# Patient Record
Sex: Male | Born: 1960 | Race: White | Hispanic: No | Marital: Married | Smoking: Never smoker
Health system: Southern US, Community
[De-identification: ages and names within clinical notes are randomized; demographics above are authoritative.]

---

## 2001-07-27 ENCOUNTER — Encounter: Payer: Self-pay | Admitting: Surgery

## 2001-07-27 ENCOUNTER — Encounter: Admission: RE | Admit: 2001-07-27 | Discharge: 2001-07-27 | Payer: Self-pay | Admitting: Surgery

## 2001-07-28 ENCOUNTER — Ambulatory Visit (HOSPITAL_BASED_OUTPATIENT_CLINIC_OR_DEPARTMENT_OTHER): Admission: RE | Admit: 2001-07-28 | Discharge: 2001-07-28 | Payer: Self-pay | Admitting: Surgery

## 2001-07-28 ENCOUNTER — Encounter: Payer: Self-pay | Admitting: Surgery

## 2001-07-28 ENCOUNTER — Encounter (INDEPENDENT_AMBULATORY_CARE_PROVIDER_SITE_OTHER): Payer: Self-pay | Admitting: Specialist

## 2009-11-30 ENCOUNTER — Encounter: Admission: RE | Admit: 2009-11-30 | Discharge: 2009-11-30 | Payer: Self-pay | Admitting: Gastroenterology

## 2009-12-06 ENCOUNTER — Ambulatory Visit: Payer: Self-pay | Admitting: Oncology

## 2009-12-07 ENCOUNTER — Encounter (INDEPENDENT_AMBULATORY_CARE_PROVIDER_SITE_OTHER): Payer: Self-pay | Admitting: General Surgery

## 2009-12-07 ENCOUNTER — Inpatient Hospital Stay (HOSPITAL_COMMUNITY): Admission: RE | Admit: 2009-12-07 | Discharge: 2009-12-11 | Payer: Self-pay | Admitting: General Surgery

## 2009-12-24 LAB — CBC WITH DIFFERENTIAL/PLATELET
BASO%: 1.6 % (ref 0.0–2.0)
Basophils Absolute: 0.2 10*3/uL — ABNORMAL HIGH (ref 0.0–0.1)
EOS%: 3.2 % (ref 0.0–7.0)
Eosinophils Absolute: 0.3 10*3/uL (ref 0.0–0.5)
HCT: 31.8 % — ABNORMAL LOW (ref 38.4–49.9)
HGB: 9 g/dL — ABNORMAL LOW (ref 13.0–17.1)
LYMPH%: 19.7 % (ref 14.0–49.0)
MCH: 19.1 pg — ABNORMAL LOW (ref 27.2–33.4)
MCHC: 28.3 g/dL — ABNORMAL LOW (ref 32.0–36.0)
MCV: 67.4 fL — ABNORMAL LOW (ref 79.3–98.0)
MONO#: 0.6 10*3/uL (ref 0.1–0.9)
MONO%: 6.4 % (ref 0.0–14.0)
NEUT#: 6.6 10*3/uL — ABNORMAL HIGH (ref 1.5–6.5)
NEUT%: 69.1 % (ref 39.0–75.0)
Platelets: 551 10*3/uL — ABNORMAL HIGH (ref 140–400)
RBC: 4.72 10*6/uL (ref 4.20–5.82)
RDW: 21.5 % — ABNORMAL HIGH (ref 11.0–14.6)
WBC: 9.6 10*3/uL (ref 4.0–10.3)
lymph#: 1.9 10*3/uL (ref 0.9–3.3)
nRBC: 0 % (ref 0–0)

## 2010-01-18 ENCOUNTER — Ambulatory Visit: Payer: Self-pay | Admitting: Oncology

## 2010-01-21 LAB — CBC WITH DIFFERENTIAL/PLATELET
BASO%: 0 % (ref 0.0–2.0)
Basophils Absolute: 0 10*3/uL (ref 0.0–0.1)
EOS%: 0.9 % (ref 0.0–7.0)
Eosinophils Absolute: 0.1 10*3/uL (ref 0.0–0.5)
HCT: 37.3 % — ABNORMAL LOW (ref 38.4–49.9)
HGB: 11.8 g/dL — ABNORMAL LOW (ref 13.0–17.1)
LYMPH%: 15.3 % (ref 14.0–49.0)
MCH: 23.8 pg — ABNORMAL LOW (ref 27.2–33.4)
MCHC: 31.5 g/dL — ABNORMAL LOW (ref 32.0–36.0)
MCV: 75.5 fL — ABNORMAL LOW (ref 79.3–98.0)
MONO#: 0.5 10*3/uL (ref 0.1–0.9)
MONO%: 5.9 % (ref 0.0–14.0)
NEUT#: 6 10*3/uL (ref 1.5–6.5)
NEUT%: 77.9 % — ABNORMAL HIGH (ref 39.0–75.0)
Platelets: 322 10*3/uL (ref 140–400)
RBC: 4.94 10*6/uL (ref 4.20–5.82)
RDW: 30.8 % — ABNORMAL HIGH (ref 11.0–14.6)
WBC: 7.7 10*3/uL (ref 4.0–10.3)
lymph#: 1.2 10*3/uL (ref 0.9–3.3)

## 2010-01-31 ENCOUNTER — Ambulatory Visit: Payer: Self-pay | Admitting: Genetic Counselor

## 2010-03-21 ENCOUNTER — Ambulatory Visit: Payer: Self-pay | Admitting: Oncology

## 2010-03-25 LAB — CBC WITH DIFFERENTIAL/PLATELET
BASO%: 0.5 % (ref 0.0–2.0)
Basophils Absolute: 0 10*3/uL (ref 0.0–0.1)
EOS%: 1.4 % (ref 0.0–7.0)
Eosinophils Absolute: 0.1 10*3/uL (ref 0.0–0.5)
HCT: 43.6 % (ref 38.4–49.9)
HGB: 14.5 g/dL (ref 13.0–17.1)
LYMPH%: 19.3 % (ref 14.0–49.0)
MCH: 27.6 pg (ref 27.2–33.4)
MCHC: 33.2 g/dL (ref 32.0–36.0)
MCV: 82.9 fL (ref 79.3–98.0)
MONO#: 0.6 10*3/uL (ref 0.1–0.9)
MONO%: 7.9 % (ref 0.0–14.0)
NEUT#: 5.8 10*3/uL (ref 1.5–6.5)
NEUT%: 70.9 % (ref 39.0–75.0)
Platelets: 291 10*3/uL (ref 140–400)
RBC: 5.26 10*6/uL (ref 4.20–5.82)
RDW: 17 % — ABNORMAL HIGH (ref 11.0–14.6)
WBC: 8.2 10*3/uL (ref 4.0–10.3)
lymph#: 1.6 10*3/uL (ref 0.9–3.3)

## 2010-05-22 ENCOUNTER — Ambulatory Visit: Payer: Self-pay | Admitting: Oncology

## 2010-05-22 ENCOUNTER — Ambulatory Visit: Payer: Self-pay | Admitting: Genetic Counselor

## 2010-09-19 ENCOUNTER — Ambulatory Visit: Payer: Self-pay | Admitting: Oncology

## 2010-09-23 ENCOUNTER — Other Ambulatory Visit
Admission: RE | Admit: 2010-09-23 | Discharge: 2010-09-23 | Payer: Self-pay | Source: Home / Self Care | Admitting: Oncology

## 2010-09-23 LAB — CBC WITH DIFFERENTIAL/PLATELET
BASO%: 0.4 % (ref 0.0–2.0)
Basophils Absolute: 0 10*3/uL (ref 0.0–0.1)
EOS%: 1.8 % (ref 0.0–7.0)
Eosinophils Absolute: 0.1 10*3/uL (ref 0.0–0.5)
HCT: 42.1 % (ref 38.4–49.9)
HGB: 14.4 g/dL (ref 13.0–17.1)
LYMPH%: 23.8 % (ref 14.0–49.0)
MCH: 30 pg (ref 27.2–33.4)
MCHC: 34.2 g/dL (ref 32.0–36.0)
MCV: 87.5 fL (ref 79.3–98.0)
MONO#: 0.6 10*3/uL (ref 0.1–0.9)
MONO%: 7.8 % (ref 0.0–14.0)
NEUT#: 4.9 10*3/uL (ref 1.5–6.5)
NEUT%: 66.2 % (ref 39.0–75.0)
Platelets: 275 10*3/uL (ref 140–400)
RBC: 4.81 10*6/uL (ref 4.20–5.82)
RDW: 13.2 % (ref 11.0–14.6)
WBC: 7.4 10*3/uL (ref 4.0–10.3)
lymph#: 1.8 10*3/uL (ref 0.9–3.3)

## 2010-09-30 LAB — CYTOLOGY, URINE

## 2010-12-08 LAB — DIFFERENTIAL
Basophils Absolute: 0 10*3/uL (ref 0.0–0.1)
Basophils Relative: 0 % (ref 0–1)
Eosinophils Relative: 0 % (ref 0–5)
Lymphocytes Relative: 19 % (ref 12–46)
Lymphs Abs: 1.8 10*3/uL (ref 0.7–4.0)
Monocytes Absolute: 1.4 10*3/uL — ABNORMAL HIGH (ref 0.1–1.0)
Monocytes Relative: 8 % (ref 3–12)
Neutro Abs: 14.4 10*3/uL — ABNORMAL HIGH (ref 1.7–7.7)
Neutrophils Relative %: 70 % (ref 43–77)

## 2010-12-08 LAB — COMPREHENSIVE METABOLIC PANEL
BUN: 16 mg/dL (ref 6–23)
CO2: 28 mEq/L (ref 19–32)
Chloride: 106 mEq/L (ref 96–112)
Creatinine, Ser: 1.01 mg/dL (ref 0.4–1.5)
GFR calc non Af Amer: >60 mL/min (ref >60)
Total Bilirubin: 0.7 mg/dL (ref 0.3–1.2)

## 2010-12-08 LAB — CBC
HCT: 26.5 % — ABNORMAL LOW (ref 39.0–52.0)
Hemoglobin: 7.8 g/dL — ABNORMAL LOW (ref 13.0–17.0)
Hemoglobin: 8 g/dL — ABNORMAL LOW (ref 13.0–17.0)
MCHC: 29.6 g/dL — ABNORMAL LOW (ref 30.0–36.0)
MCHC: 30 g/dL (ref 30.0–36.0)
MCHC: 30.3 g/dL (ref 30.0–36.0)
MCHC: 30.4 g/dL (ref 30.0–36.0)
MCV: 63.2 fL — ABNORMAL LOW (ref 78.0–100.0)
MCV: 63.6 fL — ABNORMAL LOW (ref 78.0–100.0)
MCV: 63.6 fL — ABNORMAL LOW (ref 78.0–100.0)
MCV: 63.7 fL — ABNORMAL LOW (ref 78.0–100.0)
Platelets: 245 10*3/uL (ref 150–400)
RBC: 4.15 MIL/uL — ABNORMAL LOW (ref 4.22–5.81)
RBC: 4.15 MIL/uL — ABNORMAL LOW (ref 4.22–5.81)
RDW: 19.7 % — ABNORMAL HIGH (ref 11.5–15.5)
WBC: 8.2 10*3/uL (ref 4.0–10.5)

## 2010-12-08 LAB — CROSSMATCH: ABO/RH(D): A POS

## 2010-12-08 LAB — BASIC METABOLIC PANEL
CO2: 27 mEq/L (ref 19–32)
Chloride: 102 mEq/L (ref 96–112)
Creatinine, Ser: 0.95 mg/dL (ref 0.4–1.5)
GFR calc Af Amer: >60 mL/min (ref >60)
Sodium: 134 mEq/L — ABNORMAL LOW (ref 135–145)

## 2010-12-08 LAB — PROTIME-INR
INR: 1.03 (ref 0.00–1.49)
Prothrombin Time: 13.4 seconds (ref 11.6–15.2)

## 2011-01-31 NOTE — Op Note (Signed)
[place]. [facility]  Patient:    Jonathan Pennington, Jonathan Pennington Visit Number: [id number] MRN: [id number]          Service Type: DSU Location: [facility] Attending Physician:  Andre Lefort Proc. Date: 07/28/01 Admit Date:  07/28/2001   CC:         Kristian Covey, M.D., [facility]   Operative Report  DATE OF BIRTH:  10/29/1960  CCS NUMBER:  [id number]  PREOPERATIVE DIAGNOSIS:  Melanoma, right leg, Clarks level IV, Breslow level 0.81 millimeters.  POSTOPERATIVE DIAGNOSIS:  Melanoma, right leg, Clarks level IV, Breslow level 0.81 millimeters.  OPERATIONS PERFORMED: 1. Wide excision, melanoma, right thigh. 2. Right inguinal sentinel lymph node biopsy.  SURGEON:  Sandria Bales. Ezzard Standing, M.D.  ASSISTANT:  None.  ANESTHESIA:  General with an LMA anesthesia.  INDICATION FOR PROCEDURE:  Mr. Dilworth in a 50 year old white male who had a melanoma excised from the lateral aspect of his right thigh by Dr. Evelena Peat, which was a Breslows level 0.81 mm with a Clarks level IV.  He now comes for wide excision of this melanoma and a sentinel lymph node biopsy.  DESCRIPTION OF PROCEDURE:  The patient had been to radiology where he had injection of tagged radioisotope around the right thigh lesion.  He then had an LMA anesthesia, marked the skin, infiltrated with about a half of cc of __________________ around the melanoma site.  I was unable to get the neoprobe out and identify a hot spot in the right groin, which I marked. I then prepped him with Betadine solution and sterilely draped.  I went after the sentinel lymph node first, which was found in the right groin, fairly superficial.  I made about a 3 cm incision, excised the node.  The node was not blue, but was clearly hot with counts of 650 with background of four. There was no other hot node and I saw no blue dye.  __________________ a hot not blue node, closed the subcutaneous tissue  with 3-0 Vicryl suture and the skin with a 5-0 monocril subcuticular suture.  I then turned my attention to the right thigh where he had about a maybe an 8 mm healing wound.  I tried to get 1.5-2 cm around this on the right thigh in all directions.  I then did an elliptical incision, which was probably 9 cm long, about 4 cm wide, took this down to the fascial layer.  I marked the layer towards the head as being a short suture and the medial as being the long suture.  I then used the Bovie electrocautery for hemostasis, closed it with 3-0 Vicryl and 2-0 nylon suture in the skin in interrupted fashion.  Both were dressed with sterile gauze.  The right thigh was wrapped with an Ace bandage.  Patient tolerated the procedure well.  Pathology is pending at the time of this dictation.  He will be discharged home today and given Vicodin for pain, and see me back in a week for follow up. Attending Physician:  Andre Lefort DD:  07/28/01 TD:  07/28/01 Job: [id number]

## 2013-10-31 ENCOUNTER — Other Ambulatory Visit: Payer: Self-pay | Admitting: Neurosurgery

## 2013-10-31 DIAGNOSIS — M5416 Radiculopathy, lumbar region: Secondary | ICD-10-CM

## 2013-11-03 ENCOUNTER — Other Ambulatory Visit: Payer: Self-pay | Admitting: Neurosurgery

## 2013-11-03 ENCOUNTER — Inpatient Hospital Stay
Admission: RE | Admit: 2013-11-03 | Discharge: 2013-11-03 | Disposition: A | Discharge status: Home / Self Care | Payer: Self-pay | Source: Ambulatory Visit | Attending: Neurosurgery | Admitting: Neurosurgery

## 2013-11-03 ENCOUNTER — Ambulatory Visit
Admission: RE | Admit: 2013-11-03 | Discharge: 2013-11-03 | Disposition: A | Discharge status: Home / Self Care | Payer: BC Managed Care – PPO | Source: Ambulatory Visit | Attending: Neurosurgery | Admitting: Neurosurgery

## 2013-11-03 VITALS — BP 140/81 | HR 54 | Ht 69.0 in | Wt 190.0 lb

## 2013-11-03 DIAGNOSIS — M5416 Radiculopathy, lumbar region: Secondary | ICD-10-CM

## 2013-11-03 DIAGNOSIS — IMO0002 Reserved for concepts with insufficient information to code with codable children: Secondary | ICD-10-CM

## 2013-11-03 MED ORDER — IOHEXOL 180 MG/ML  SOLN
10.0000 mL | Freq: Once | INTRAMUSCULAR | Status: AC | PRN
  Administered 2013-11-03: 10 mL via INTRATHECAL

## 2013-11-03 MED ORDER — DIAZEPAM 5 MG PO TABS
10.0000 mg | ORAL_TABLET | Freq: Once | ORAL | Status: AC
  Administered 2013-11-03: 10 mg via ORAL

## 2013-11-03 NOTE — Discharge Instructions (Signed)

## 2013-11-07 ENCOUNTER — Inpatient Hospital Stay: Admission: RE | Admit: 2013-11-07 | Payer: Self-pay | Source: Ambulatory Visit

## 2013-11-07 ENCOUNTER — Other Ambulatory Visit: Payer: Self-pay

## 2014-09-15 IMAGING — CT CT L SPINE W/ CM
4 of 10 series · 12 of 33 positions shown, 14 images · IV contrast (omnipaque)
Comparison: MR 10/20/2013

FLUOROSCOPY TIME:  33 seconds

CLINICAL DATA: Low back and left lower extremity pain for about 40
days. No previous lumbar surgery.

EXAM:
LUMBAR MYELOGRAM
CT LUMBAR SPINE WITH INTRATHECAL CONTRAST
TECHNIQUE: The procedure, risks (including but not limited to bleeding,
infection, organ damage ), benefits, and alternatives were explained
to the patient. Questions regarding the procedure were encouraged
and answered. The patient understands and consents to the procedure.
An appropriate entry site was determined under fluoroscopy. Operator
donned sterile gloves and mask. Skin site was marked, prepped with
Betadine, and draped in usual sterile fashion, and infiltrated
locally with 1% lidocaine. A 22 gauge spinal needle was advanced
into the thecal sac at L4 from right posterolateral approach. Clear
colorless CSF returned. 17 ml Omnipaque 180 were administered
intrathecally for lumbar myelography, followed by axial CT scanning
of the lumbar spine. I personally performed the lumbar puncture and
administered the intrathecal contrast. I also personally supervised
acquisition of the myelogram images. Coronal and sagittal
reconstructions were generated from the axial scan.

[Series 2: l spine bone · axial · 0.29mm/px · z∈[-14,+62]mm · 2 of 92 slices shown, 3 images]
[im 31/92  soft-tissue]
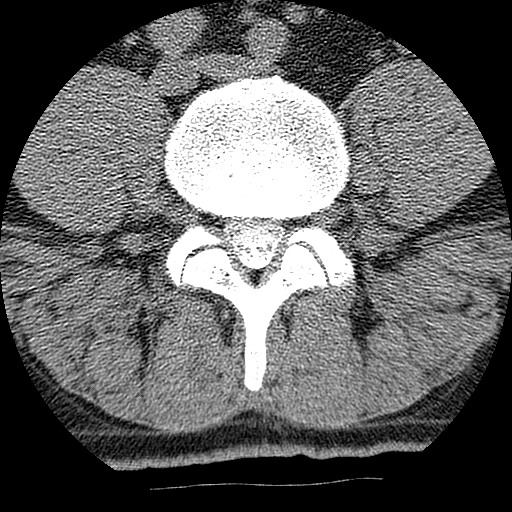
[im 31/92  bone]
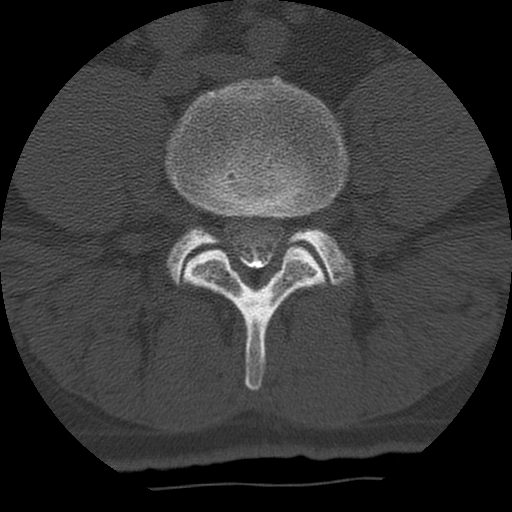
[im 61/92  bone]
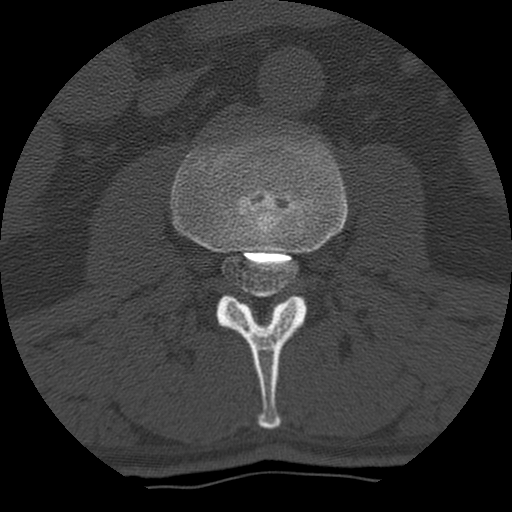

[Series 3: l spine soft · axial · 0.29mm/px · z∈[-16,+64]mm · 2 of 90 slices shown]
[im 30/90  soft-tissue]
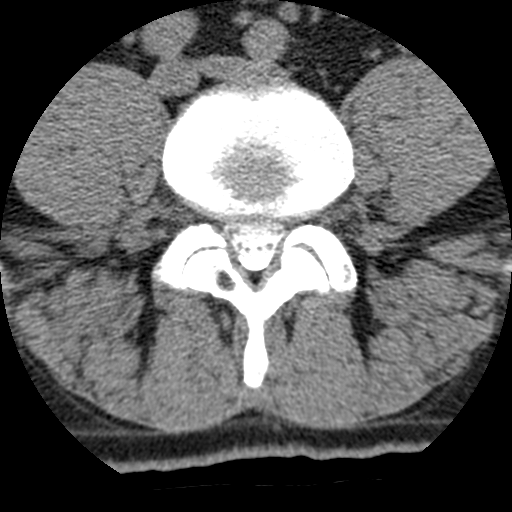
[im 60/90  soft-tissue]
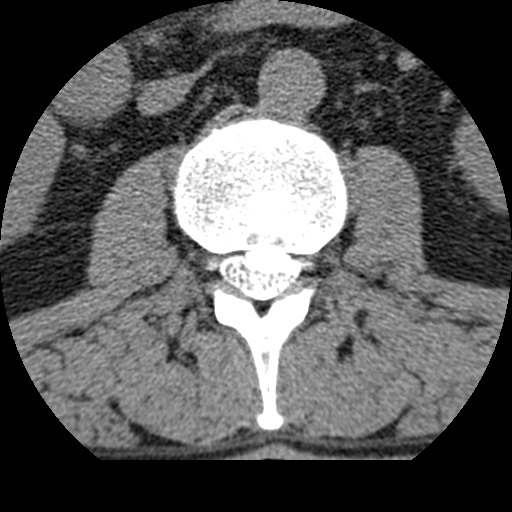

[Series 400: sag · sagittal · 0.46mm/px · 5 of 60 slices shown, 6 images]
[im 20/60  bone]
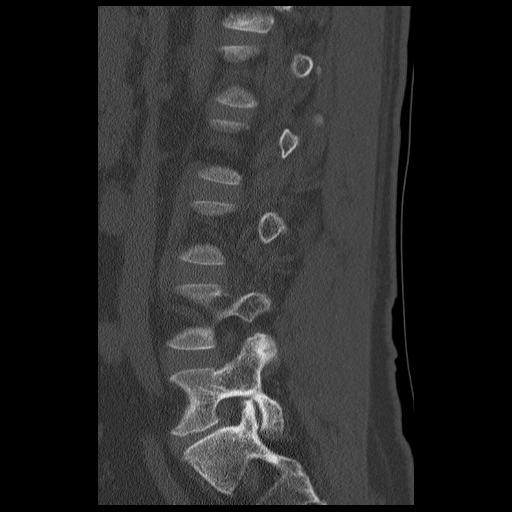
[im 25/60  bone]
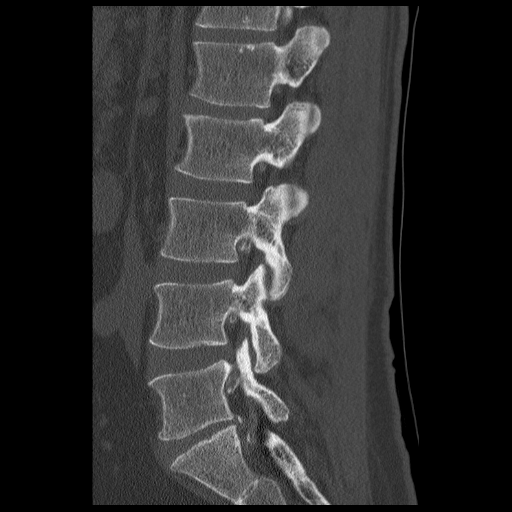
[im 30/60  soft-tissue]
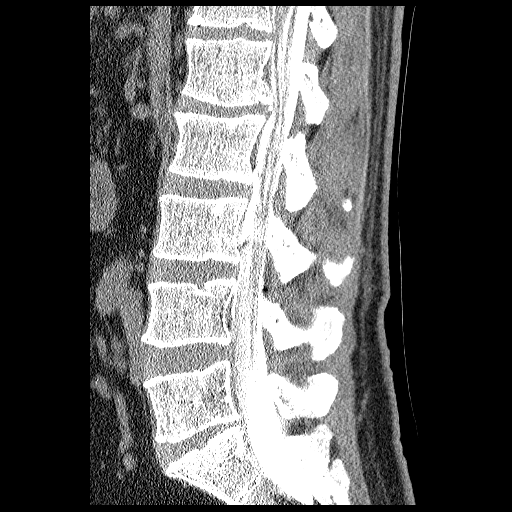
[im 30/60  bone]
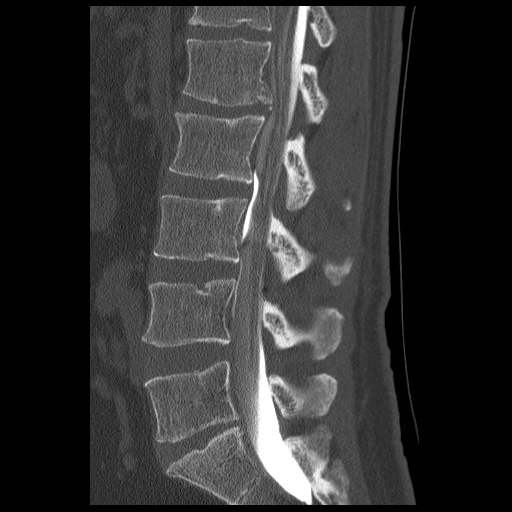
[im 35/60  bone]
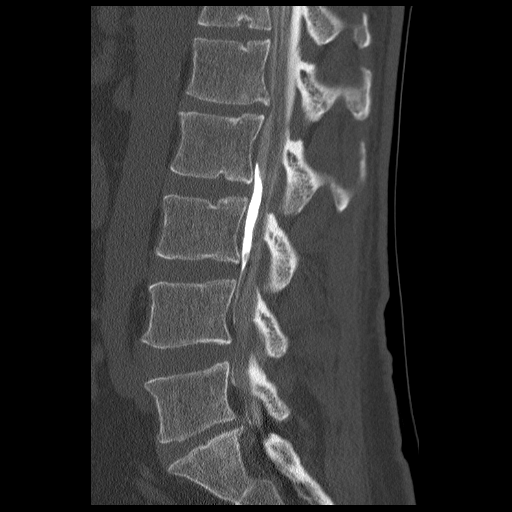
[im 40/60  bone]
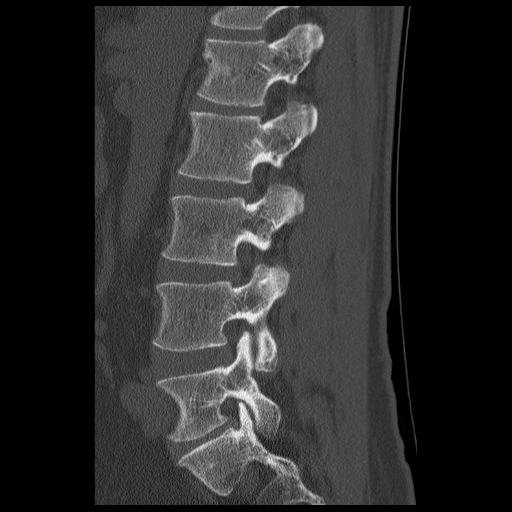

[Series 401: cor · coronal · 0.46mm/px · 3 of 60 slices shown]
[im 12/60  bone]
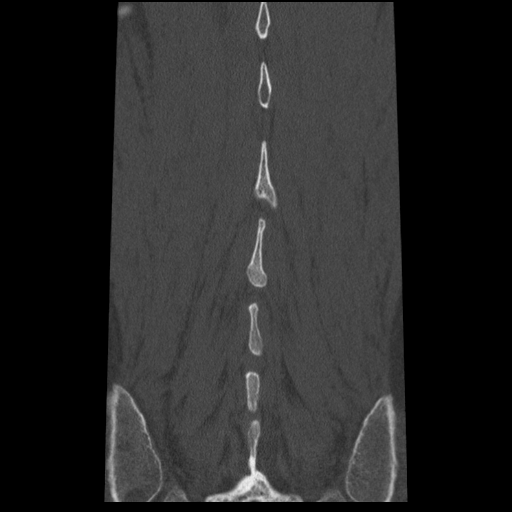
[im 24/60  bone]
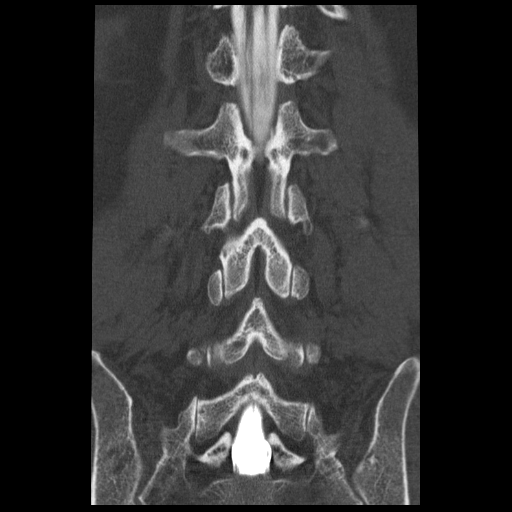
[im 36/60  bone]
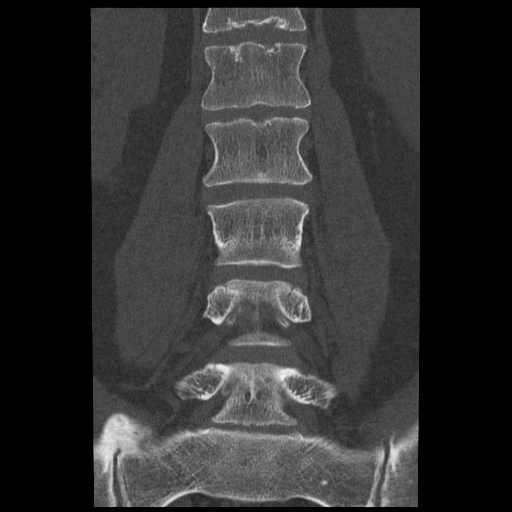

[12 of 33 positions shown; findings below may reference images not displayed]

FINDINGS: There are 5 non rib-bearing lumbar segments, labeled L1-L5. Normal
alignment.

T12-L1: Small Schmorl's nodes in the endplates. No disc bulge or
protrusion. Central canal and foramina patent. Conus terminates
behind L1.

L1-2: Small Schmorl's nodes in the endplates. There is a small
central bulge, partially calcified, without significant spinal
stenosis. Foramina remain patent.

L2-3: Small Schmorl's nodes in the endplates. No disc bulge or
protrusion. Mixed injection noted. Central canal and foramina
patent.

L3-4: No disc bulge or protrusion. Central canal and foramina
patent.

L4-5: Mild broad posterior disc bulge. Central canal and foramina
patent.

L5-S1: Mild narrowing of the interspace. Mild broad asymmetric
posterior disc bulge with small associated endplate spurs, minimal
encroachment upon the left neural foramen. Central canal remains
patent. No stenosis.

Scattered aortoiliac calcified plaque. Remainder visualized
paraspinal soft tissues unremarkable.
IMPRESSION: 1. Mild disc bulges L1-2 and L4-5 without compressive pathology.
2. Asymmetric disc bulge L5-S1 with mild left foraminal
encroachment.

## 2014-09-15 IMAGING — RF DG MYELOGRAPHY LUMBAR INJ LUMBOSACRAL
14 series · 14 of 14 positions shown · IV contrast (omnipaque)
Comparison: MR 10/20/2013

FLUOROSCOPY TIME:  33 seconds

CLINICAL DATA: Low back and left lower extremity pain for about 40
days. No previous lumbar surgery.

EXAM:
LUMBAR MYELOGRAM
CT LUMBAR SPINE WITH INTRATHECAL CONTRAST
TECHNIQUE: The procedure, risks (including but not limited to bleeding,
infection, organ damage ), benefits, and alternatives were explained
to the patient. Questions regarding the procedure were encouraged
and answered. The patient understands and consents to the procedure.
An appropriate entry site was determined under fluoroscopy. Operator
donned sterile gloves and mask. Skin site was marked, prepped with
Betadine, and draped in usual sterile fashion, and infiltrated
locally with 1% lidocaine. A 22 gauge spinal needle was advanced
into the thecal sac at L4 from right posterolateral approach. Clear
colorless CSF returned. 17 ml Omnipaque 180 were administered
intrathecally for lumbar myelography, followed by axial CT scanning
of the lumbar spine. I personally performed the lumbar puncture and
administered the intrathecal contrast. I also personally supervised
acquisition of the myelogram images. Coronal and sagittal
reconstructions were generated from the axial scan.

[Series 1: (hospital) · 1 of 1 slices shown]
[im 1/1]
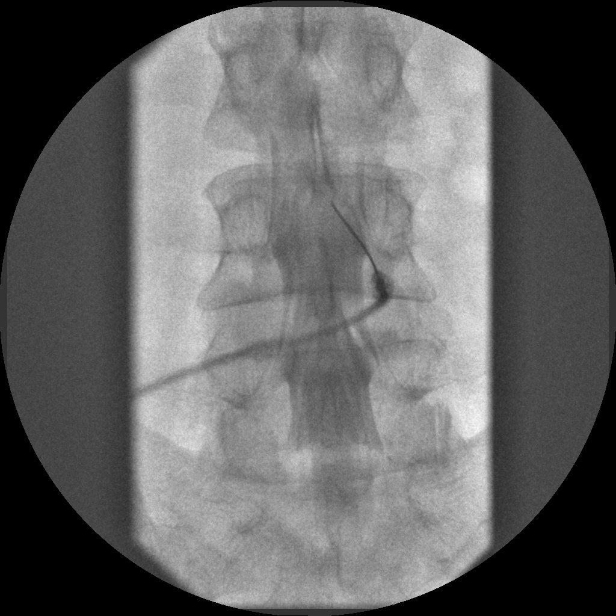

[Series 2: myelogram  white · 1 of 1 slices shown (1 of 13)]
[im 1/1]
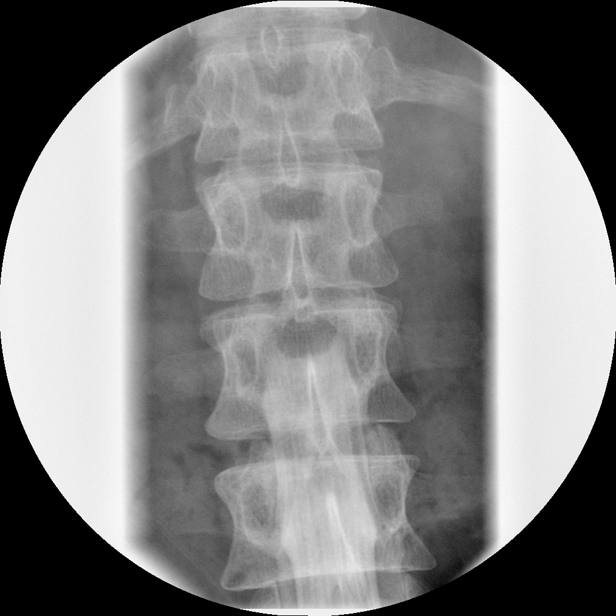

[Series 3: myelogram  white · 1 of 1 slices shown (2 of 13)]
[im 1/1]
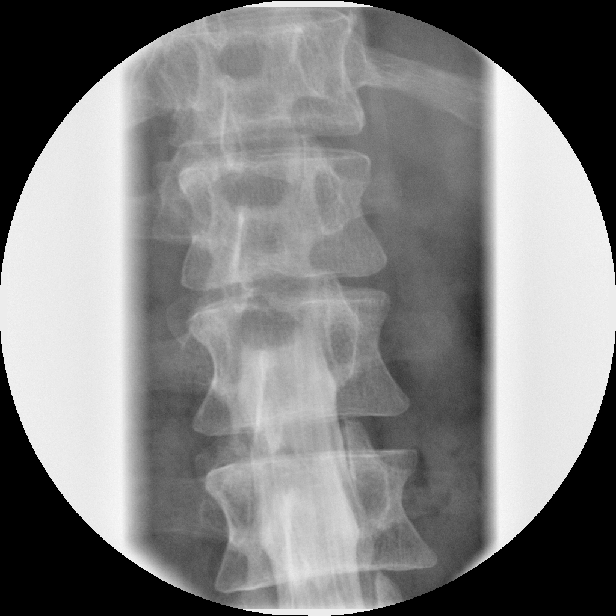

[Series 4: myelogram  white · 1 of 1 slices shown (3 of 13)]
[im 1/1]
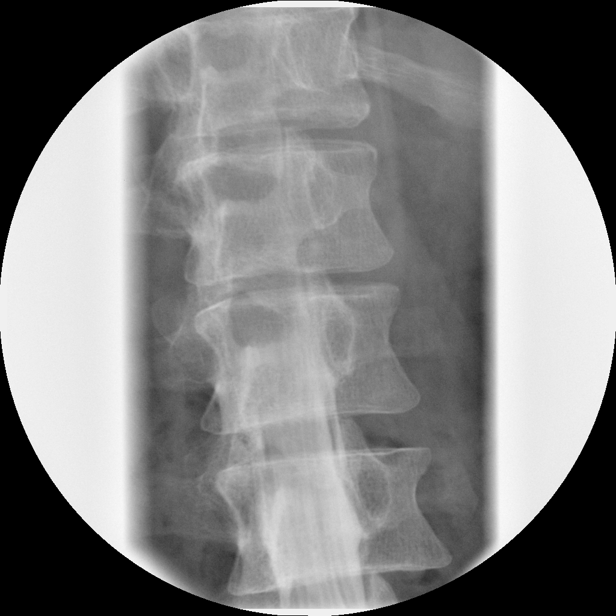

[Series 5: myelogram  white · 1 of 1 slices shown (4 of 13)]
[im 1/1]
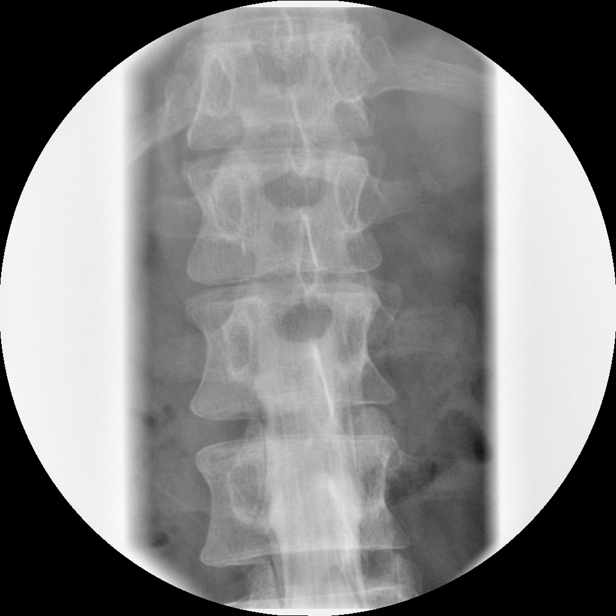

[Series 6: myelogram  white · 1 of 1 slices shown (5 of 13)]
[im 1/1]
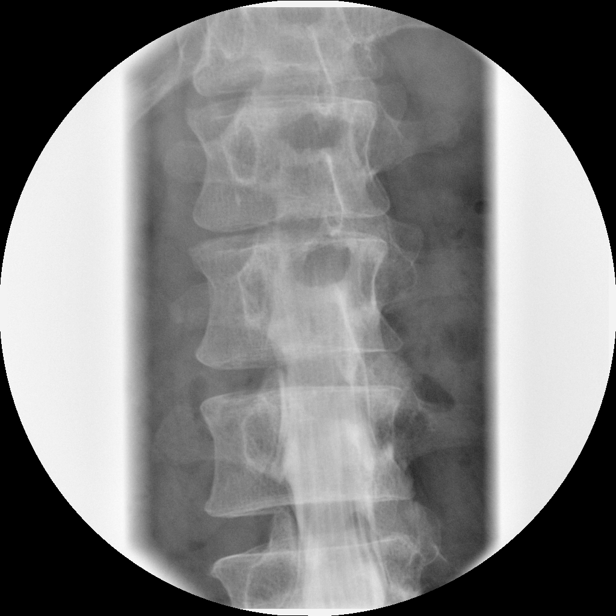

[Series 7: myelogram  white · 1 of 1 slices shown (6 of 13)]
[im 1/1]
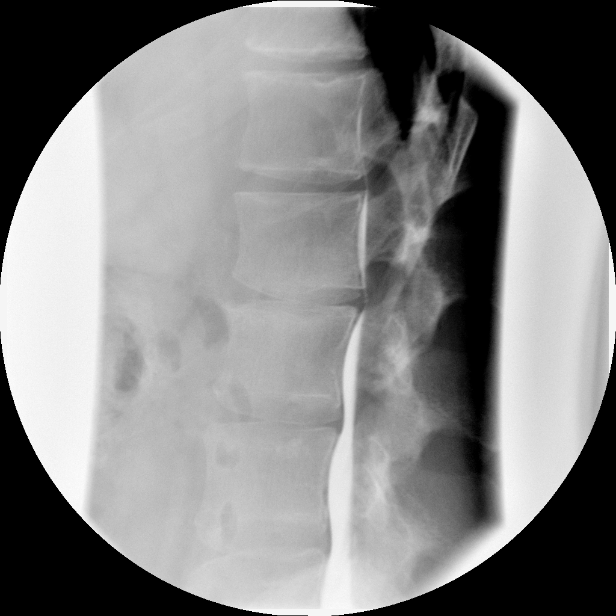

[Series 8: myelogram  white · 1 of 1 slices shown (7 of 13)]
[im 1/1]
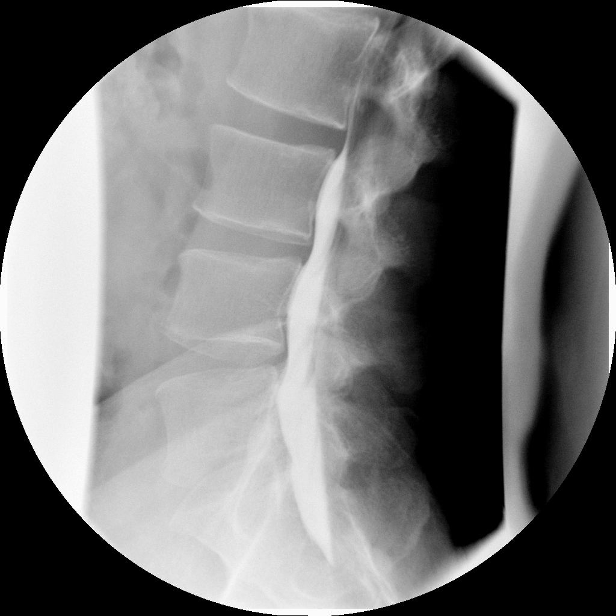

[Series 9: myelogram  white · 1 of 1 slices shown (8 of 13)]
[im 1/1]
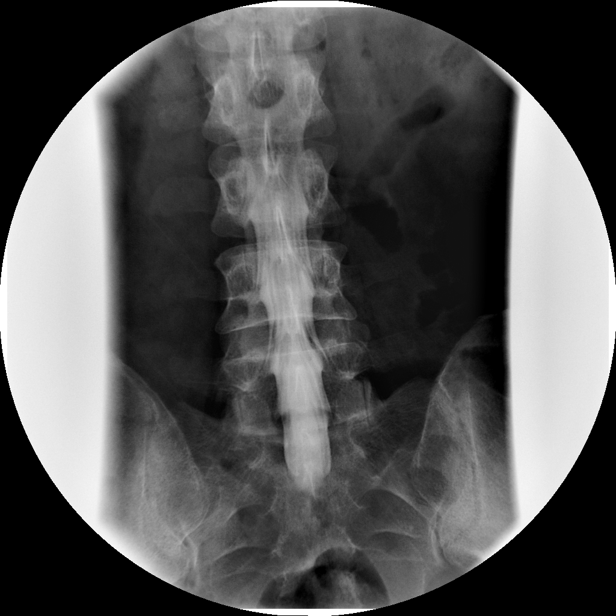

[Series 10: myelogram  white · 1 of 1 slices shown (9 of 13)]
[im 1/1]
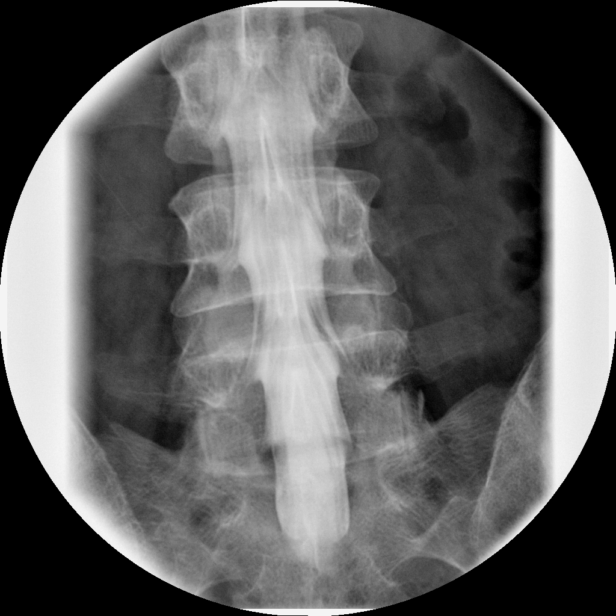

[Series 11: myelogram  white · 1 of 1 slices shown (10 of 13)]
[im 1/1]
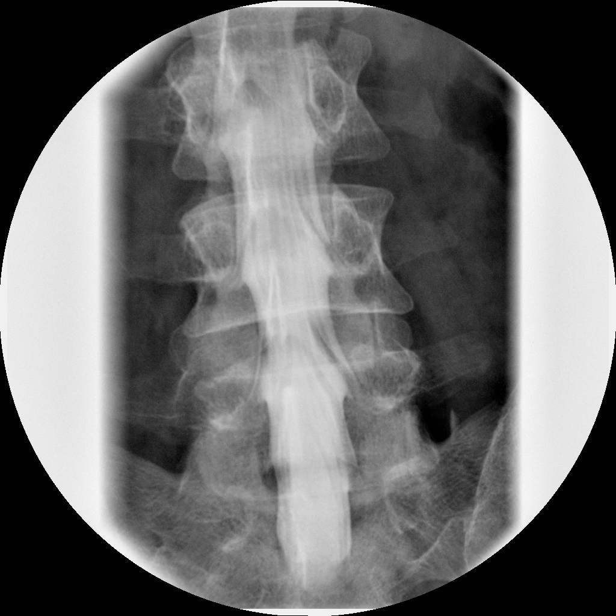

[Series 12: myelogram  white · 1 of 1 slices shown (11 of 13)]
[im 1/1]
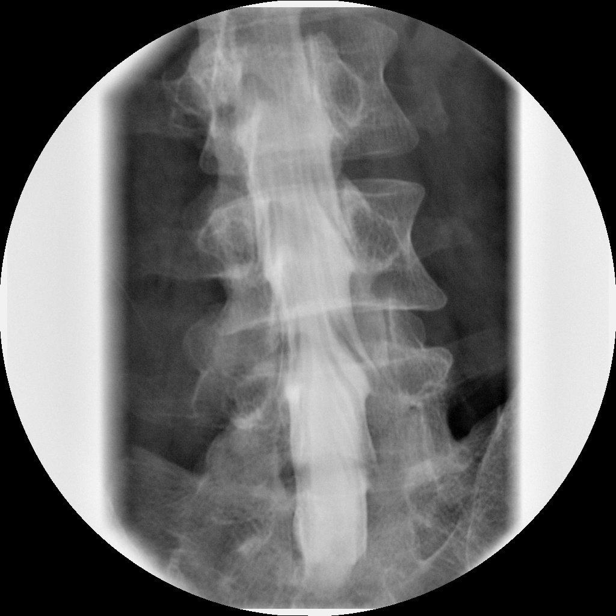

[Series 13: myelogram  white · 1 of 1 slices shown (12 of 13)]
[im 1/1]
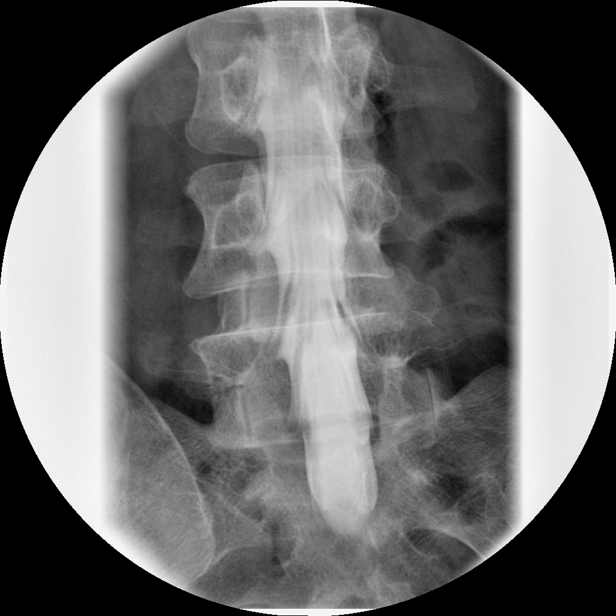

[Series 14: myelogram  white · 1 of 1 slices shown (13 of 13)]
[im 1/1]
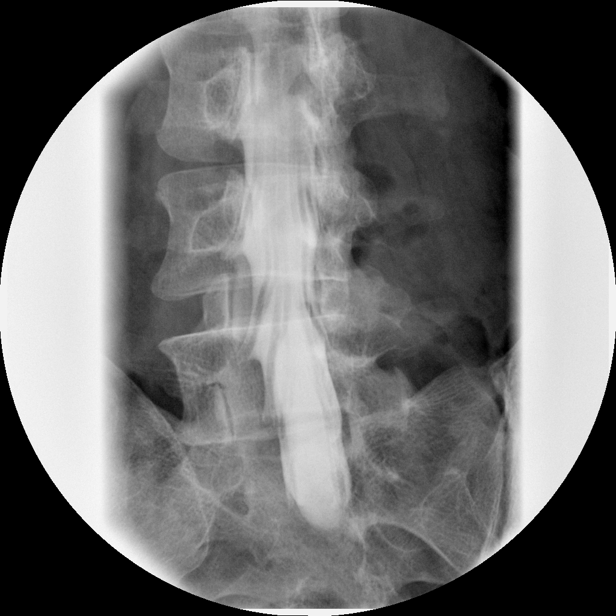

[14 of 14 positions shown; findings below may reference images not displayed]

FINDINGS: There are 5 non rib-bearing lumbar segments, labeled L1-L5. Normal
alignment.

T12-L1: Small Schmorl's nodes in the endplates. No disc bulge or
protrusion. Central canal and foramina patent. Conus terminates
behind L1.

L1-2: Small Schmorl's nodes in the endplates. There is a small
central bulge, partially calcified, without significant spinal
stenosis. Foramina remain patent.

L2-3: Small Schmorl's nodes in the endplates. No disc bulge or
protrusion. Mixed injection noted. Central canal and foramina
patent.

L3-4: No disc bulge or protrusion. Central canal and foramina
patent.

L4-5: Mild broad posterior disc bulge. Central canal and foramina
patent.

L5-S1: Mild narrowing of the interspace. Mild broad asymmetric
posterior disc bulge with small associated endplate spurs, minimal
encroachment upon the left neural foramen. Central canal remains
patent. No stenosis.

Scattered aortoiliac calcified plaque. Remainder visualized
paraspinal soft tissues unremarkable.
IMPRESSION: 1. Mild disc bulges L1-2 and L4-5 without compressive pathology.
2. Asymmetric disc bulge L5-S1 with mild left foraminal
encroachment.

## 2016-03-28 ENCOUNTER — Encounter: Payer: Self-pay | Admitting: Genetic Counselor

## 2020-04-27 ENCOUNTER — Encounter: Payer: Self-pay | Admitting: Genetic Counselor

## 2024-08-22 ENCOUNTER — Telehealth (HOSPITAL_COMMUNITY): Payer: Self-pay

## 2024-08-22 NOTE — Telephone Encounter (Signed)
 Outside/paper referral received by Dr Dorisann from [facility].. Patient is not sure if he wants to do cardiac Rehab until he completes his F/U.  He will call back if interested. Insurance benefits and eligibility to be determined if interested.
